# Patient Record
Sex: Male | Born: 2000 | Race: White | Hispanic: No | Marital: Single | Smoking: Current every day smoker
Health system: Southern US, Community
[De-identification: ages and names within clinical notes are randomized; demographics above are authoritative.]

## PROBLEM LIST (undated history)

## (undated) DIAGNOSIS — I401 Isolated myocarditis: Secondary | ICD-10-CM

## (undated) DIAGNOSIS — I639 Cerebral infarction, unspecified: Secondary | ICD-10-CM

## (undated) DIAGNOSIS — F329 Major depressive disorder, single episode, unspecified: Secondary | ICD-10-CM

## (undated) HISTORY — PX: EYE SURGERY: SHX253

## (undated) HISTORY — PX: BOTOX INJECTION: SHX5754

## (undated) HISTORY — PX: WISDOM TOOTH EXTRACTION: SHX21

---
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
## 2018-04-10 IMAGING — DX DG ELBOW COMPLETE 3+V*R*
4 series · 4 of 4 positions shown · non-contrast
Comparison: None.

CLINICAL DATA: Skateboarding accident tonight with right elbow
pain.

EXAM:
RIGHT ELBOW - COMPLETE 3+ VIEW

[elbow ap]
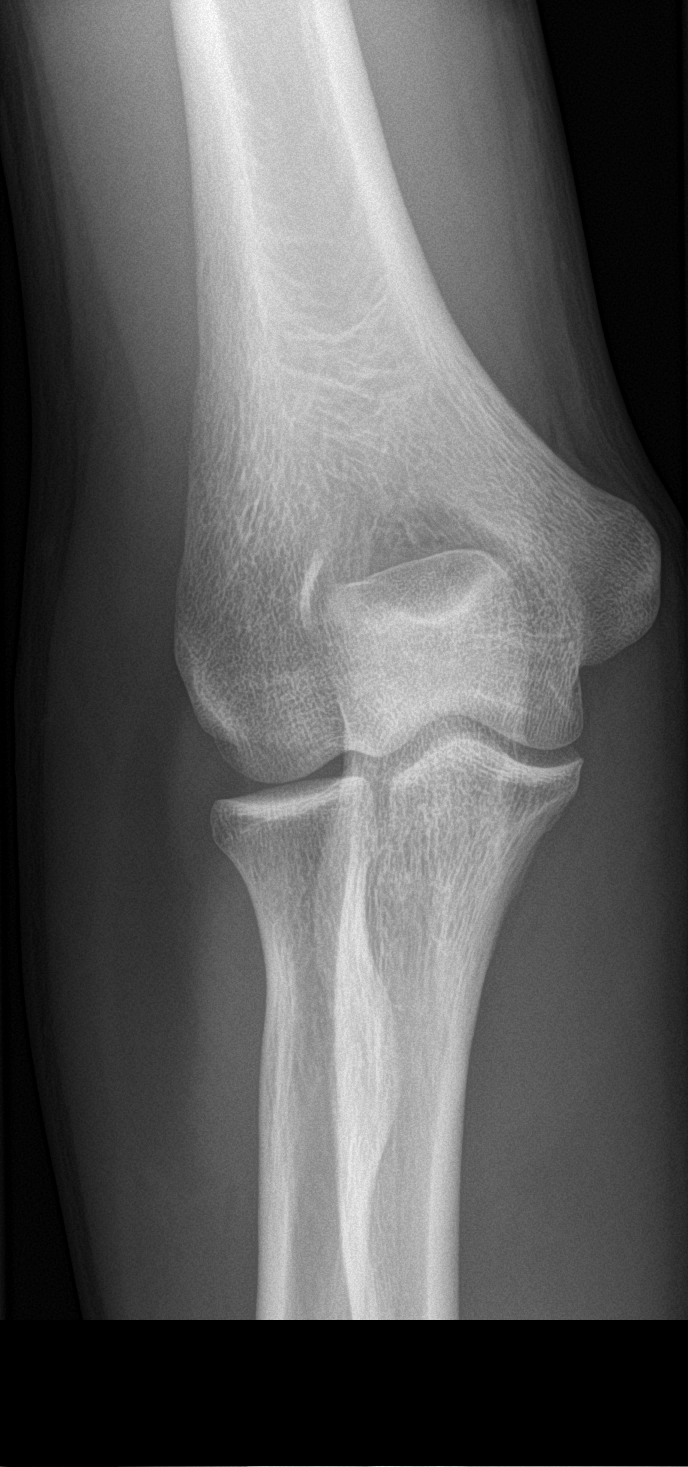

[elbow obl (1 of 2)]
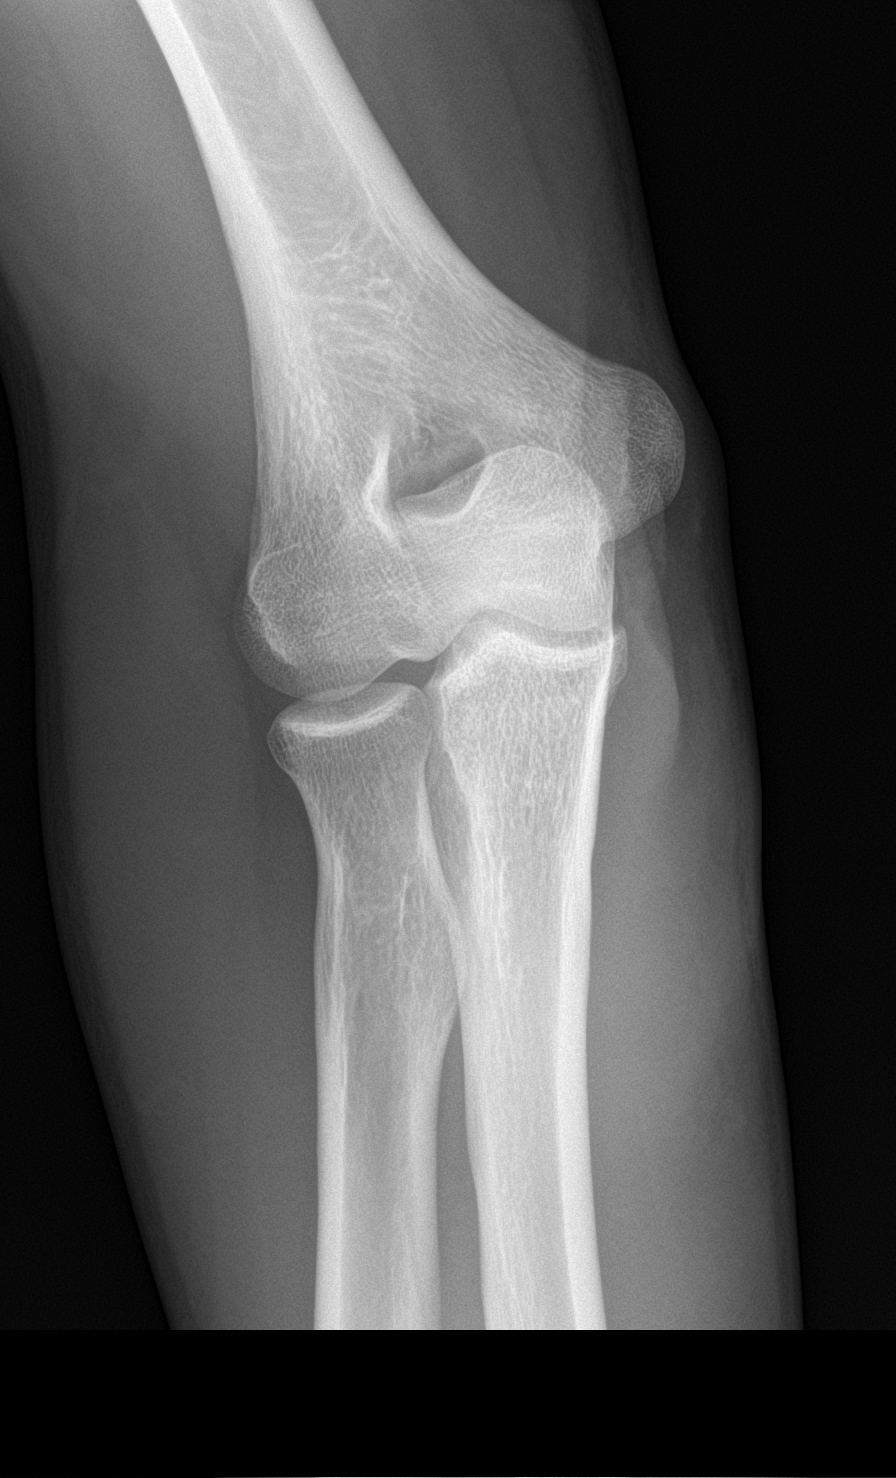

[elbow obl (2 of 2)]
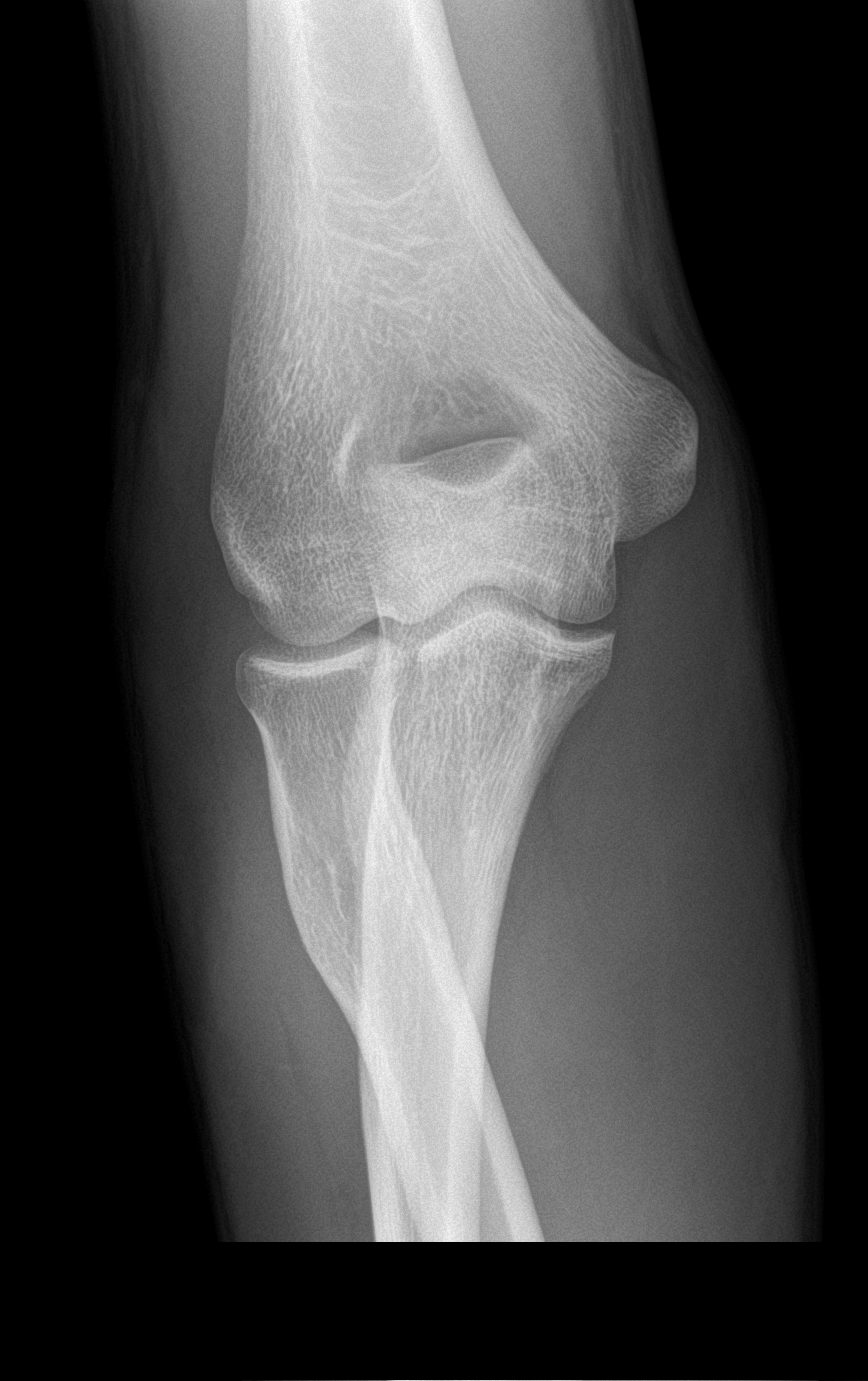

[elbow lat]
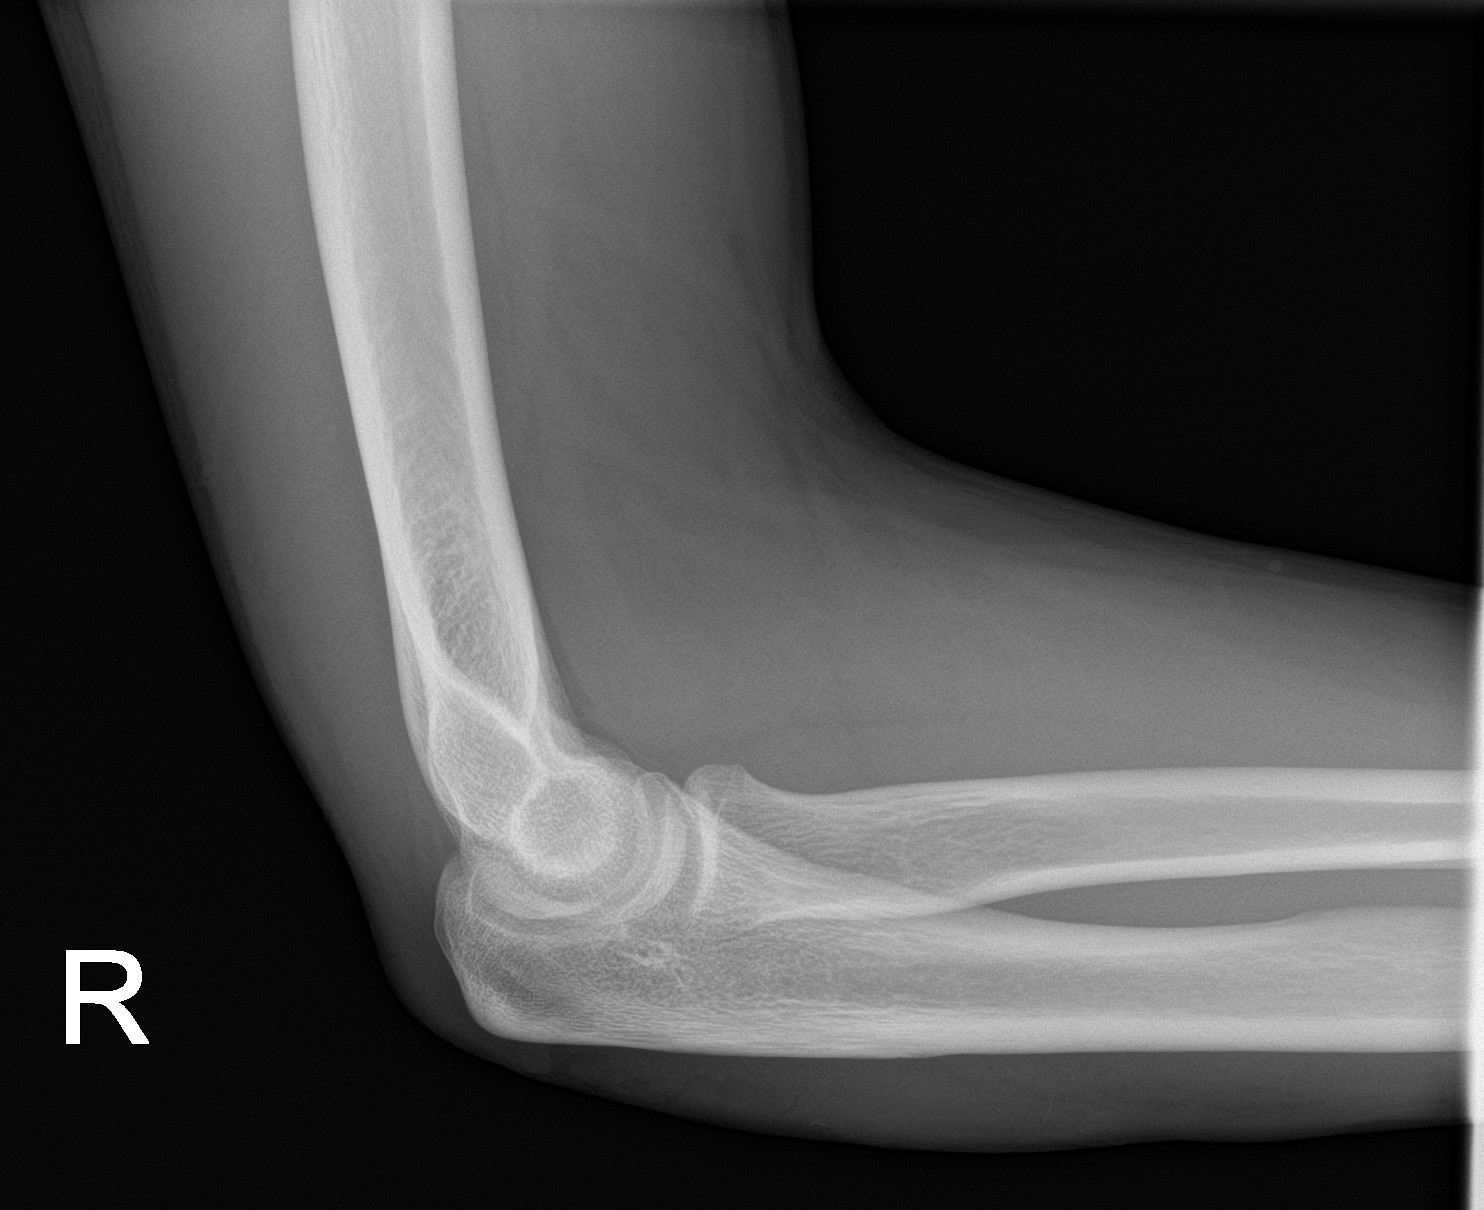

[4 of 4 positions shown; findings below may reference images not displayed]

FINDINGS: There is no evidence of fracture, dislocation, or joint effusion.
There is no evidence of arthropathy or other focal bone abnormality.
Soft tissues are unremarkable.
IMPRESSION: Negative.

## 2018-04-10 IMAGING — DX DG FOREARM 2V*R*
2 series · 2 of 2 positions shown · non-contrast
Comparison: None.

CLINICAL DATA: Skateboarding accident tonight with right forearm
pain.

EXAM:
RIGHT FOREARM - 2 VIEW

[forearm ap]
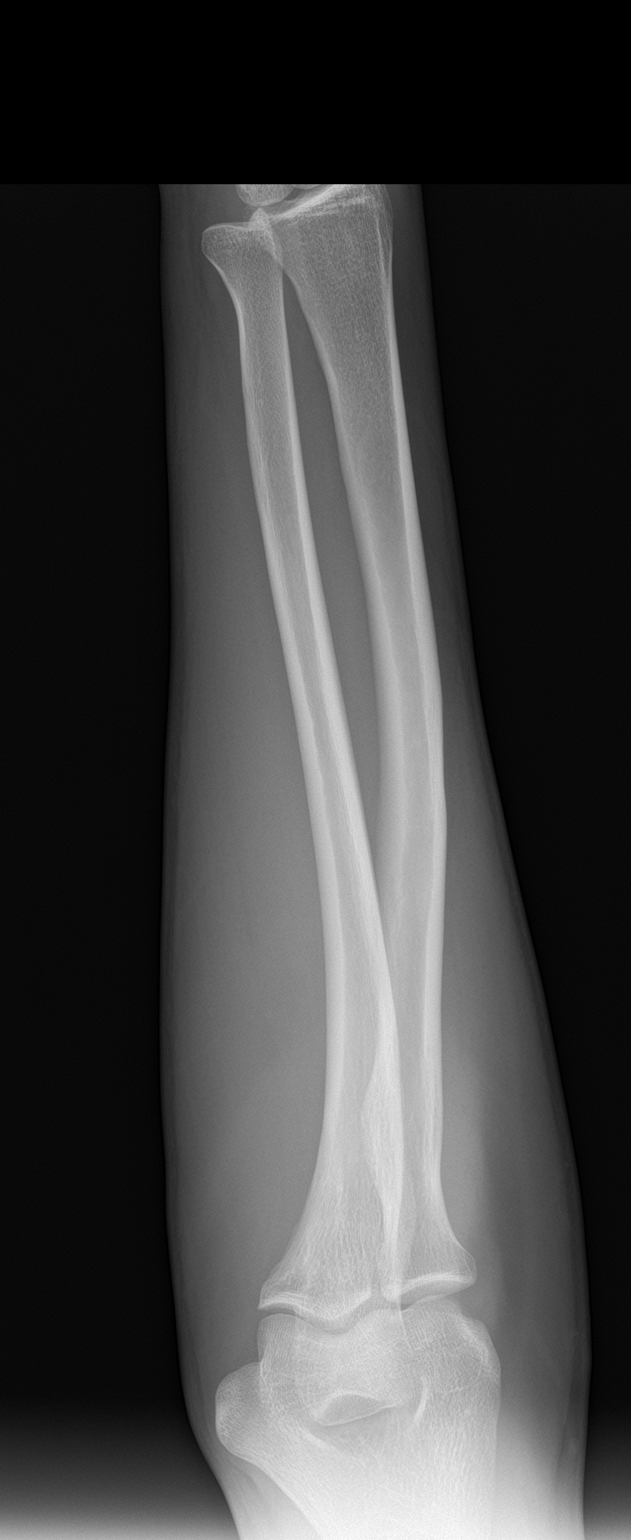

[forearm lat]
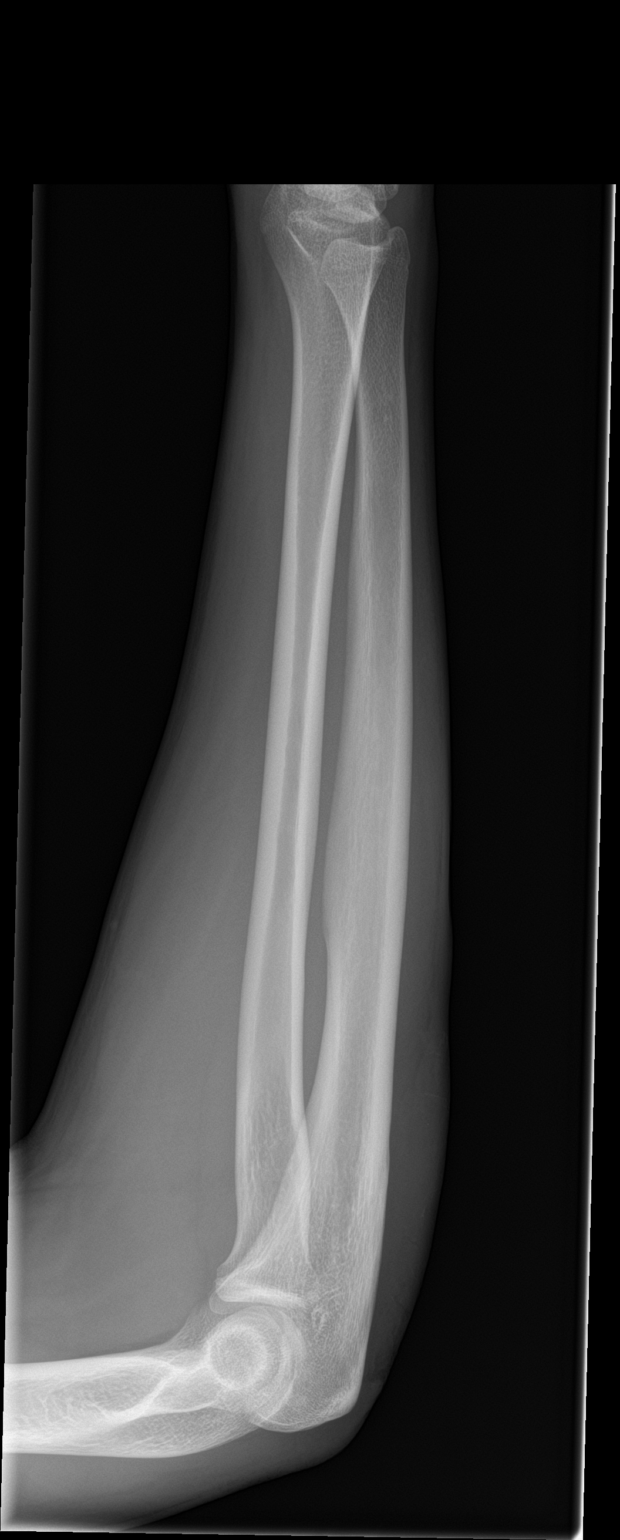

[2 of 2 positions shown; findings below may reference images not displayed]

FINDINGS: There is no evidence of fracture or other focal bone lesions. Soft
tissues are unremarkable.
IMPRESSION: Negative.

## 2018-04-17 ENCOUNTER — Emergency Department (HOSPITAL_COMMUNITY)
Admission: EM | Admit: 2018-04-17 | Discharge: 2018-04-17 | Disposition: A | Discharge status: Home / Self Care | Payer: BLUE CROSS/BLUE SHIELD | Attending: Emergency Medicine | Admitting: Emergency Medicine

## 2018-04-17 ENCOUNTER — Encounter (HOSPITAL_COMMUNITY): Payer: Self-pay | Admitting: Emergency Medicine

## 2018-04-17 DIAGNOSIS — F17210 Nicotine dependence, cigarettes, uncomplicated: Secondary | ICD-10-CM | POA: Insufficient documentation

## 2018-04-17 DIAGNOSIS — Z79899 Other long term (current) drug therapy: Secondary | ICD-10-CM | POA: Insufficient documentation

## 2018-04-17 DIAGNOSIS — F1910 Other psychoactive substance abuse, uncomplicated: Secondary | ICD-10-CM

## 2018-04-17 DIAGNOSIS — R45851 Suicidal ideations: Secondary | ICD-10-CM | POA: Insufficient documentation

## 2018-04-17 DIAGNOSIS — T50902A Poisoning by unspecified drugs, medicaments and biological substances, intentional self-harm, initial encounter: Secondary | ICD-10-CM | POA: Diagnosis present

## 2018-04-17 LAB — COMPREHENSIVE METABOLIC PANEL
ALT: 17 U/L (ref 0–44)
ANION GAP: 5 (ref 5–15)
AST: 31 U/L (ref 15–41)
Albumin: 4.3 g/dL (ref 3.5–5.0)
Alkaline Phosphatase: 52 U/L (ref 38–126)
BUN: 19 mg/dL (ref 6–20)
CHLORIDE: 107 mmol/L (ref 98–111)
CO2: 30 mmol/L (ref 22–32)
Calcium: 9.4 mg/dL (ref 8.9–10.3)
Creatinine, Ser: 0.9 mg/dL (ref 0.61–1.24)
GFR calc non Af Amer: >60 mL/min (ref >60)
Glucose, Bld: 95 mg/dL (ref 70–99)
POTASSIUM: 4 mmol/L (ref 3.5–5.1)
SODIUM: 142 mmol/L (ref 135–145)
Total Bilirubin: 0.7 mg/dL (ref 0.3–1.2)
Total Protein: 6.5 g/dL (ref 6.5–8.1)

## 2018-04-17 LAB — CBC
HCT: 47.6 % (ref 39.0–52.0)
HEMOGLOBIN: 16.2 g/dL (ref 13.0–17.0)
MCH: 29.8 pg (ref 26.0–34.0)
MCHC: 34 g/dL (ref 30.0–36.0)
MCV: 87.7 fL (ref 78.0–100.0)
Platelets: 228 10*3/uL (ref 150–400)
RBC: 5.43 MIL/uL (ref 4.22–5.81)
RDW: 12.8 % (ref 11.5–15.5)
WBC: 9.9 10*3/uL (ref 4.0–10.5)

## 2018-04-17 LAB — ETHANOL: Alcohol, Ethyl (B): <10 mg/dL (ref <10)

## 2018-04-17 LAB — SALICYLATE LEVEL

## 2018-04-17 LAB — CBG MONITORING, ED: GLUCOSE-CAPILLARY: 96 mg/dL (ref 70–99)

## 2018-04-17 LAB — ACETAMINOPHEN LEVEL

## 2018-04-17 NOTE — ED Provider Notes (Signed)
[facility]-EMERGENCY DEPT Provider Note   CSN: [id number] Arrival date & time: 04/17/18  0141     History   Chief Complaint Chief Complaint  Patient presents with  . Suicidal  . Drug Overdose    HPI Christopher Roy is a 17 y.o. male.  17 yo M with a cc of drug abuse.  The patient took a handful of pills to try and make himself feel high.  He tried to do this with Xanax as an NSAID.  He did this earlier today.  He denies suicidal ideation.  Denies any symptoms after using these medications.  He does have a history of depression and would like to start an SSRI.  He was started on something last time he was in a mental health institution but he did not like the way it made him feel and so he stopped.  He denies homicidal ideation denies current plan for suicide.  He is just requesting outpatient follow-up.  The history is provided by the patient.  Drug Overdose  This is a new problem. The current episode started 3 to 5 hours ago. The problem occurs constantly. The problem has not changed since onset.Pertinent negatives include no chest pain, no abdominal pain, no headaches and no shortness of breath. Nothing aggravates the symptoms. Nothing relieves the symptoms. He has tried nothing for the symptoms. The treatment provided no relief.    Past Medical History:  Diagnosis Date  . Acute idiopathic myocarditis   . 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    Patient Active Problem List   Diagnosis Date Noted  . 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 Chest pain 04/15/2017  . Elevated troponin   . Acute idiopathic myocarditis   . 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 Foot pain 08/08/2012  . 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 BOTOX INJECTION    . EYE SURGERY    . WISDOM TOOTH EXTRACTION          Home Medications    Prior to Admission  medications   Medication Sig Start Date End Date Taking? Authorizing Provider  escitalopram (LEXAPRO) 20 MG tablet Take 20 mg by mouth daily.    [provider]  traZODone (DESYREL) 50 MG tablet Take 50 mg by mouth at bedtime.    [provider]    Family History Family History  Problem Relation Age of Onset  . Anxiety disorder Mother     Social History Social History   Tobacco Use  . Smoking status: Current Every Day Smoker    Packs/day: 0.25    Types: Cigarettes  . Smokeless tobacco: Never Used  Substance Use Topics  . Alcohol use: No  . Drug use: No    Comment: xanax, "lean" AKA cough syrup     Allergies   Patient has no known allergies.   Review of Systems Review of Systems  Constitutional: Negative for chills and fever.  HENT: Negative for congestion and facial swelling.   Eyes: Negative for discharge and visual disturbance.  Respiratory: Negative for shortness of breath.   Cardiovascular: Negative for chest pain and palpitations.  Gastrointestinal: Negative for abdominal pain, diarrhea and vomiting.  Musculoskeletal: Negative for arthralgias and myalgias.  Skin: Negative for color change and rash.  Neurological: Negative for tremors, syncope and headaches.  Psychiatric/Behavioral: Negative for confusion and dysphoric mood.  Physical Exam Updated Vital Signs BP 115/86   Pulse 67   Temp 98.4 F (36.9 C) (Oral)   Resp (!) 21   Ht 5\' 9"  (1.753 m)   Wt 68 kg (150 lb)   SpO2 100%   BMI 22.15 kg/m   Physical Exam  Constitutional: He is oriented to person, place, and time. He appears well-developed and well-nourished.  HENT:  Head: Normocephalic and atraumatic.  Eyes: Pupils are equal, round, and reactive to light. EOM are normal.  Neck: Normal range of motion. Neck supple. No JVD present.  Cardiovascular: Normal rate and regular rhythm. Exam reveals no gallop and no friction rub.  No murmur heard. Pulmonary/Chest: No respiratory  distress. He has no wheezes.  Abdominal: He exhibits no distension and no mass. There is no tenderness. There is no guarding.  Musculoskeletal: Normal range of motion.  Neurological: He is alert and oriented to person, place, and time.  Skin: No rash noted. No pallor.  Psychiatric: He has a normal mood and affect. His behavior is normal.  Nursing note and vitals reviewed.    ED Treatments / Results  Labs (all labs ordered are listed, but only abnormal results are displayed) Labs Reviewed  ACETAMINOPHEN LEVEL - Abnormal; Notable for the following components:      Result Value   Acetaminophen (Tylenol), Serum <10 (*)    All other components within normal limits  COMPREHENSIVE METABOLIC PANEL  ETHANOL  SALICYLATE LEVEL  CBC  RAPID URINE DRUG SCREEN, HOSP PERFORMED  CBG MONITORING, ED    EKG EKG Interpretation  Date/Time:  Sunday April 17 2018 01:50:54 EDT Ventricular Rate:  61 PR Interval:    QRS Duration: 96 QT Interval:  389 QTC Calculation: 392 R Axis:   38 Text Interpretation:  Sinus rhythm ST elev, probable normal early repol pattern large t wave in v2 not seen on prior Otherwise no significant change Confirmed by Donavon Kimrey ([id number]) on 04/17/2018 4:03:03 AM   Radiology No results found.  Procedures Procedures (including critical care time)  Medications Ordered in ED Medications - No data to display   Initial Impression / Assessment and Plan / ED Course  I have reviewed the triage vital signs and the nursing notes.  Pertinent labs & imaging results that were available during my care of the patient were reviewed by me and considered in my medical decision making (see chart for details).     17  yo M with an intentional overdose to get high.  Patient did this earlier this evening.  Currently is asymptomatic.  He took a much lower dose than would be toxic.  He denies suicidal ideation.  The patient made a poor decision though I do not feel he is a risk to himself or  others.  I feel that he needs a emergent psychiatric evaluation.  We will give him follow-up as an outpatient.   6:09 AM:  I have discussed the diagnosis/risks/treatment options with the patient and believe the pt to be eligible for discharge home to follow-up with PCP. We also discussed returning to the ED immediately if new or worsening sx occur. We discussed the sx which are most concerning (e.g., sudden worsening pain, fever, inability to tolerate by mouth) that necessitate immediate return. Medications administered to the patient during their visit and any new prescriptions provided to the patient are listed below.  Medications given during this visit Medications - No data to display   The patient appears reasonably screen and/or stabilized for discharge  and I doubt any other medical condition or other [facility] requiring further screening, evaluation, or treatment in the ED at this time prior to discharge.    Final Clinical Impressions(s) / ED Diagnoses   Final diagnoses:  Drug abuse [facility])    ED Discharge Orders    None       Melene Plan, DO 04/17/18 [phone]

## 2018-04-17 NOTE — ED Triage Notes (Signed)
Per [facility], pt took approximately 15 OTC Naproxen & xanax, but pt denied stating he took the xanax x 2 nights ago.

## 2018-04-17 NOTE — ED Triage Notes (Signed)
Pt stated he took 10-15 Naproxen around midnight. He stated that he didn't necessarily want to harm himself but "if I didn't wake up, no big deal." Hx of depression is not currently seeing a psychiatrist. CAOx4.

## 2018-05-20 ENCOUNTER — Other Ambulatory Visit: Payer: Self-pay

## 2018-05-20 ENCOUNTER — Encounter (HOSPITAL_BASED_OUTPATIENT_CLINIC_OR_DEPARTMENT_OTHER): Payer: Self-pay | Admitting: Adult Health

## 2018-05-20 ENCOUNTER — Emergency Department (HOSPITAL_BASED_OUTPATIENT_CLINIC_OR_DEPARTMENT_OTHER)
Admission: EM | Admit: 2018-05-20 | Discharge: 2018-05-21 | Disposition: A | Discharge status: Home / Self Care | Payer: BLUE CROSS/BLUE SHIELD | Attending: Emergency Medicine | Admitting: Emergency Medicine

## 2018-05-20 ENCOUNTER — Emergency Department (HOSPITAL_BASED_OUTPATIENT_CLINIC_OR_DEPARTMENT_OTHER): Payer: BLUE CROSS/BLUE SHIELD

## 2018-05-20 DIAGNOSIS — Y939 Activity, unspecified: Secondary | ICD-10-CM | POA: Insufficient documentation

## 2018-05-20 DIAGNOSIS — I69354 Hemiplegia and hemiparesis following cerebral infarction affecting left non-dominant side: Secondary | ICD-10-CM | POA: Insufficient documentation

## 2018-05-20 DIAGNOSIS — Y999 Unspecified external cause status: Secondary | ICD-10-CM | POA: Diagnosis not present

## 2018-05-20 DIAGNOSIS — Y929 Unspecified place or not applicable: Secondary | ICD-10-CM | POA: Diagnosis not present

## 2018-05-20 DIAGNOSIS — Z79899 Other long term (current) drug therapy: Secondary | ICD-10-CM | POA: Diagnosis not present

## 2018-05-20 DIAGNOSIS — F17210 Nicotine dependence, cigarettes, uncomplicated: Secondary | ICD-10-CM | POA: Diagnosis not present

## 2018-05-20 DIAGNOSIS — T07XXXA Unspecified multiple injuries, initial encounter: Secondary | ICD-10-CM | POA: Diagnosis not present

## 2018-05-20 DIAGNOSIS — S51012A Laceration without foreign body of left elbow, initial encounter: Secondary | ICD-10-CM | POA: Diagnosis not present

## 2018-05-20 MED ORDER — CEFAZOLIN SODIUM-DEXTROSE 1-4 GM/50ML-% IV SOLN
1.0000 g | Freq: Once | INTRAVENOUS | Status: AC
  Administered 2018-05-20: 1 g via INTRAVENOUS
  Filled 2018-05-20: qty 50

## 2018-05-20 MED ORDER — SODIUM CHLORIDE 0.9 % IV SOLN
INTRAVENOUS | Status: DC | PRN
Start: 2018-05-20 — End: 2018-05-21
  Administered 2018-05-20: 500 mL via INTRAVENOUS
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
## 2018-05-20 NOTE — ED Notes (Signed)
X-ray at bedside
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
## 2018-05-21 MED ORDER — CEPHALEXIN 500 MG PO CAPS: 500.0000 mg | ORAL_CAPSULE | Freq: Four times a day (QID) | ORAL | 0 refills | Status: AC

## 2018-05-21 MED ORDER — LIDOCAINE-EPINEPHRINE 2 %-1:100000 IJ SOLN
20.0000 mL | Freq: Once | INTRAMUSCULAR | Status: DC
  Filled 2018-05-21: qty 20

## 2018-05-21 MED ORDER — LIDOCAINE-EPINEPHRINE (PF) 2 %-1:200000 IJ SOLN
INTRAMUSCULAR | Status: AC
  Administered 2018-05-21: 01:00:00
  Filled 2018-05-21: qty 20

## 2018-05-21 MED ORDER — FENTANYL CITRATE (PF) 100 MCG/2ML IJ SOLN
100.0000 ug | Freq: Once | INTRAMUSCULAR | Status: AC
  Administered 2018-05-21: 100 ug via INTRAVENOUS

## 2018-05-21 MED ORDER — FENTANYL CITRATE (PF) 100 MCG/2ML IJ SOLN
INTRAMUSCULAR | Status: AC
  Filled 2018-05-21: qty 2

## 2018-05-21 NOTE — ED Notes (Signed)
All wounds irrigated with SafeCleanse, bacitracin applied, and wrapped with kerlex.

## 2020-08-21 ENCOUNTER — Ambulatory Visit: Payer: BLUE CROSS/BLUE SHIELD
